# Patient Record
Sex: Female | Born: 1950 | Race: Black or African American | Hispanic: No | Marital: Married | Smoking: Never smoker
Health system: Southern US, Community
[De-identification: ages and names within clinical notes are randomized; demographics above are authoritative.]

## PROBLEM LIST (undated history)

## (undated) DIAGNOSIS — T7840XA Allergy, unspecified, initial encounter: Secondary | ICD-10-CM

## (undated) DIAGNOSIS — E119 Type 2 diabetes mellitus without complications: Secondary | ICD-10-CM

## (undated) DIAGNOSIS — N189 Chronic kidney disease, unspecified: Secondary | ICD-10-CM

## (undated) HISTORY — DX: Chronic kidney disease, unspecified: N18.9

## (undated) HISTORY — DX: Type 2 diabetes mellitus without complications: E11.9

## (undated) HISTORY — DX: Allergy, unspecified, initial encounter: T78.40XA

---

## 1997-09-01 ENCOUNTER — Ambulatory Visit (HOSPITAL_COMMUNITY): Admission: RE | Admit: 1997-09-01 | Discharge: 1997-09-01 | Payer: Self-pay | Admitting: Family Medicine

## 1999-01-20 ENCOUNTER — Encounter: Payer: Self-pay | Admitting: Obstetrics and Gynecology

## 1999-01-20 ENCOUNTER — Ambulatory Visit (HOSPITAL_COMMUNITY): Admission: RE | Admit: 1999-01-20 | Discharge: 1999-01-20 | Payer: Self-pay | Admitting: Family Medicine

## 2001-03-26 ENCOUNTER — Ambulatory Visit (HOSPITAL_COMMUNITY): Admission: RE | Admit: 2001-03-26 | Discharge: 2001-03-26 | Payer: Self-pay | Admitting: Family Medicine

## 2001-03-26 ENCOUNTER — Encounter: Payer: Self-pay | Admitting: Family Medicine

## 2002-09-08 ENCOUNTER — Ambulatory Visit (HOSPITAL_COMMUNITY): Admission: RE | Admit: 2002-09-08 | Discharge: 2002-09-08 | Payer: Self-pay | Admitting: Family Medicine

## 2002-09-08 ENCOUNTER — Encounter: Payer: Self-pay | Admitting: Family Medicine

## 2002-11-01 ENCOUNTER — Emergency Department (HOSPITAL_COMMUNITY): Admission: EM | Admit: 2002-11-01 | Discharge: 2002-11-01 | Payer: Self-pay | Admitting: Emergency Medicine

## 2004-03-14 ENCOUNTER — Ambulatory Visit: Payer: Self-pay | Admitting: Family Medicine

## 2004-05-04 ENCOUNTER — Ambulatory Visit: Payer: Self-pay | Admitting: Family Medicine

## 2004-09-29 ENCOUNTER — Ambulatory Visit (HOSPITAL_COMMUNITY): Admission: RE | Admit: 2004-09-29 | Discharge: 2004-09-29 | Payer: Self-pay | Admitting: Family Medicine

## 2005-05-14 ENCOUNTER — Encounter: Admission: RE | Admit: 2005-05-14 | Discharge: 2005-05-14 | Payer: Self-pay | Admitting: Family Medicine

## 2006-01-21 ENCOUNTER — Emergency Department (HOSPITAL_COMMUNITY): Admission: EM | Admit: 2006-01-21 | Discharge: 2006-01-22 | Payer: Self-pay | Admitting: Emergency Medicine

## 2006-01-25 ENCOUNTER — Inpatient Hospital Stay (HOSPITAL_COMMUNITY): Admission: EM | Admit: 2006-01-25 | Discharge: 2006-02-01 | Payer: Self-pay | Admitting: Emergency Medicine

## 2006-02-04 ENCOUNTER — Ambulatory Visit (HOSPITAL_COMMUNITY): Admission: RE | Admit: 2006-02-04 | Discharge: 2006-02-05 | Payer: Self-pay | Admitting: General Surgery

## 2006-02-11 ENCOUNTER — Emergency Department (HOSPITAL_COMMUNITY): Admission: EM | Admit: 2006-02-11 | Discharge: 2006-02-12 | Payer: Self-pay | Admitting: Emergency Medicine

## 2006-10-21 ENCOUNTER — Ambulatory Visit: Admission: RE | Admit: 2006-10-21 | Discharge: 2006-10-21 | Payer: Self-pay | Admitting: Urology

## 2007-08-11 ENCOUNTER — Encounter: Admission: RE | Admit: 2007-08-11 | Discharge: 2007-08-11 | Payer: Self-pay | Admitting: Family Medicine

## 2007-08-26 ENCOUNTER — Encounter: Admission: RE | Admit: 2007-08-26 | Discharge: 2007-08-26 | Payer: Self-pay | Admitting: Family Medicine

## 2007-11-28 ENCOUNTER — Ambulatory Visit (HOSPITAL_COMMUNITY): Admission: RE | Admit: 2007-11-28 | Discharge: 2007-11-28 | Payer: Self-pay | Admitting: Family Medicine

## 2008-06-06 IMAGING — CR DG CHEST 2V
2 series · 2 of 2 positions shown · non-contrast
Comparison: 05/14/05.

CLINICAL DATA: Fever, cough and congestion.  
 CHEST - 2 VIEW:

[view not recorded (1 of 2)]
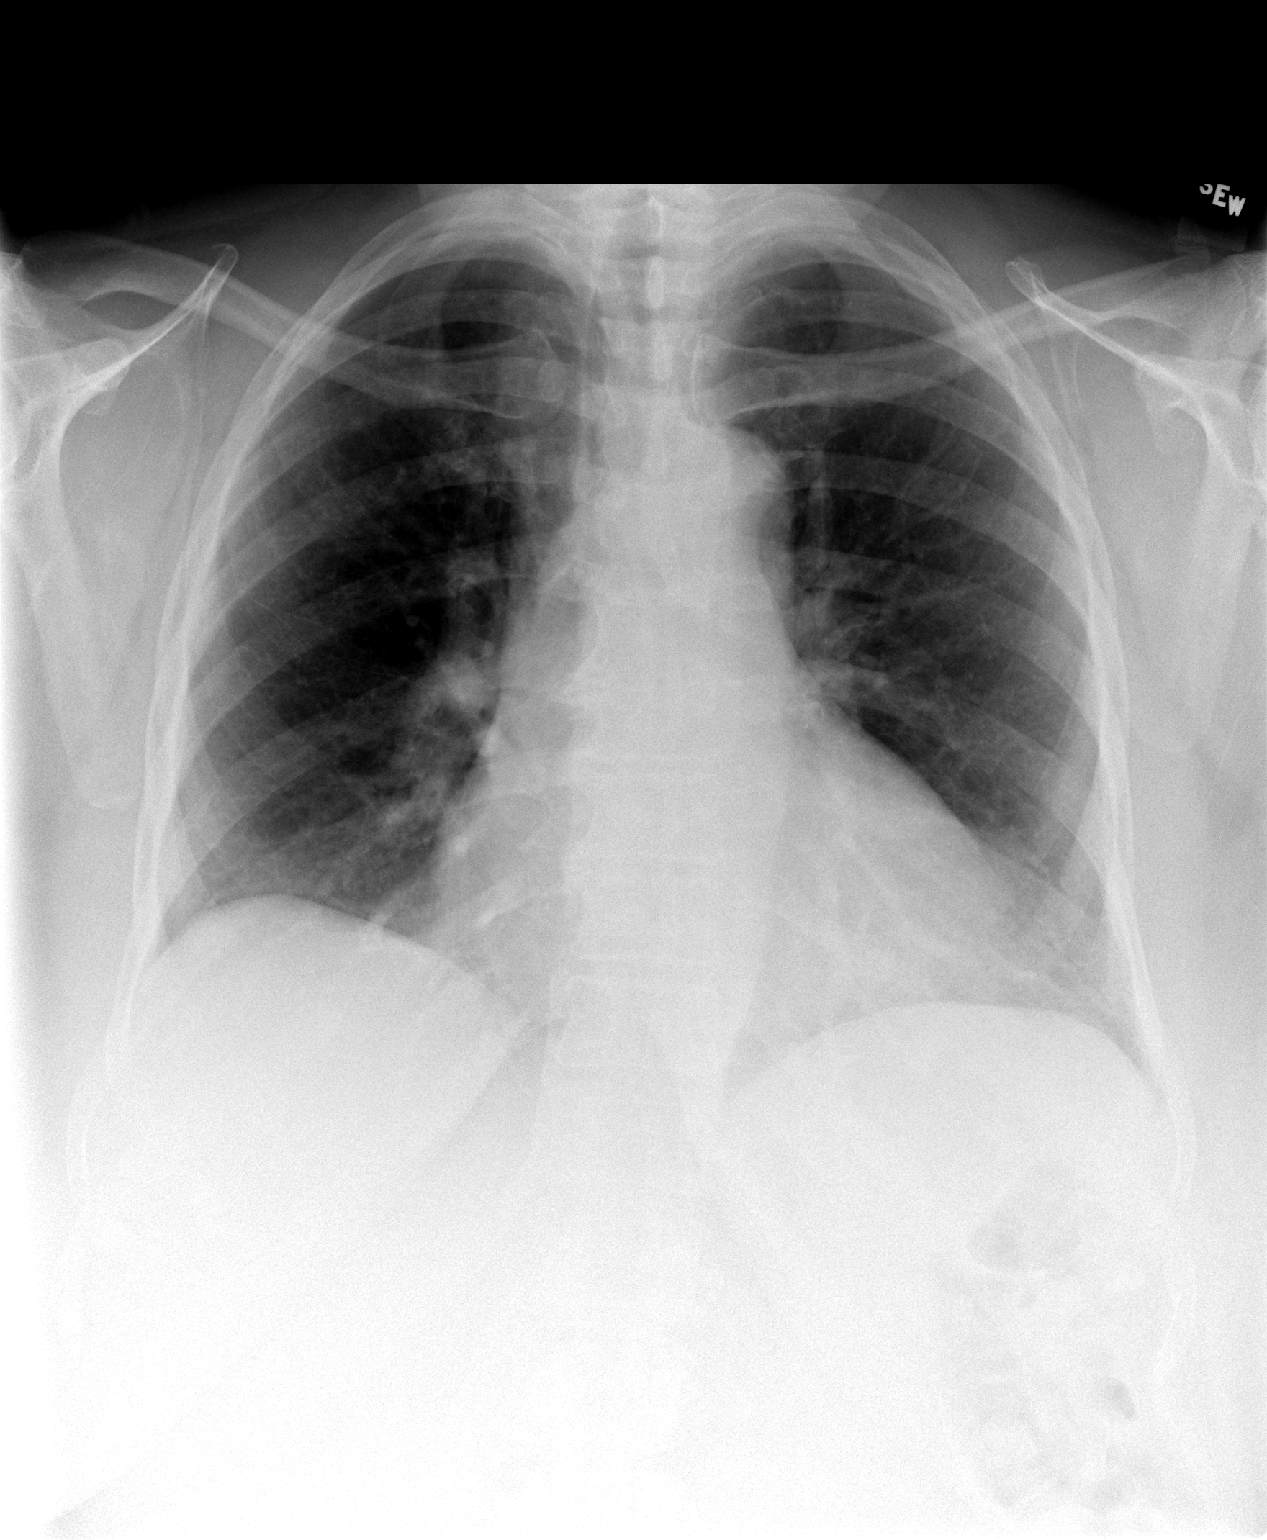

[view not recorded (2 of 2)]
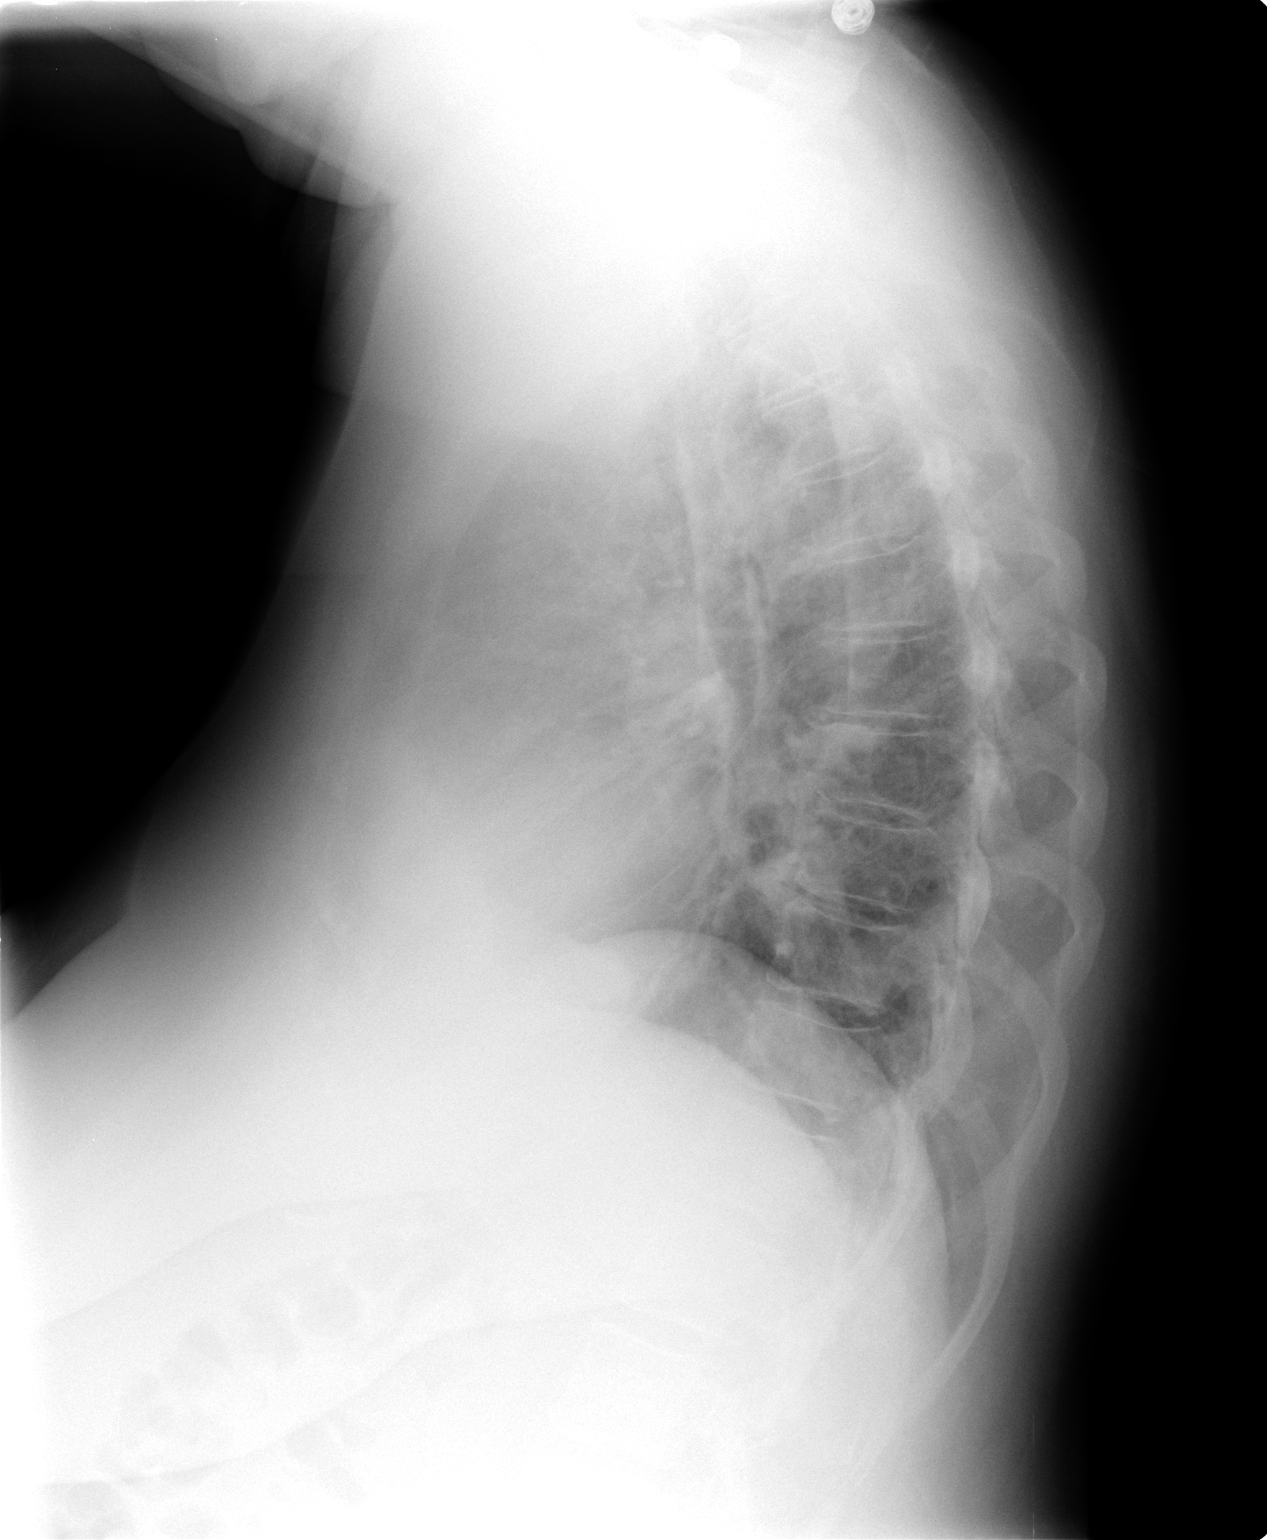

[2 of 2 positions shown; findings below may reference images not displayed]

FINDINGS: Heart size is enlarged.  There is no effusion or edema.  No airspace opacities are identified.
IMPRESSION: 1.  Cardiac enlargement.  
 2.  No acute findings.

## 2009-02-15 ENCOUNTER — Encounter: Payer: Self-pay | Admitting: Family Medicine

## 2010-02-25 ENCOUNTER — Inpatient Hospital Stay (HOSPITAL_COMMUNITY)
Admission: EM | Admit: 2010-02-25 | Discharge: 2010-03-03 | Payer: Self-pay | Source: Home / Self Care | Attending: Internal Medicine | Admitting: Internal Medicine

## 2010-02-27 LAB — URINE MICROSCOPIC-ADD ON

## 2010-02-27 LAB — CBC
HCT: 34.5 % — ABNORMAL LOW (ref 36.0–46.0)
HCT: 29.9 % — ABNORMAL LOW (ref 36.0–46.0)
Hemoglobin: 11.3 g/dL — ABNORMAL LOW (ref 12.0–15.0)
Hemoglobin: 9.7 g/dL — ABNORMAL LOW (ref 12.0–15.0)
MCH: 28 pg (ref 26.0–34.0)
MCH: 27.8 pg (ref 26.0–34.0)
MCHC: 32.8 g/dL (ref 30.0–36.0)
MCHC: 32.4 g/dL (ref 30.0–36.0)
MCV: 85.6 fL (ref 78.0–100.0)
MCV: 85.7 fL (ref 78.0–100.0)
Platelets: 286 10*3/uL (ref 150–400)
Platelets: 245 10*3/uL (ref 150–400)
RBC: 4.03 MIL/uL (ref 3.87–5.11)
RBC: 3.49 MIL/uL — ABNORMAL LOW (ref 3.87–5.11)
RDW: 14.7 % (ref 11.5–15.5)
RDW: 14.9 % (ref 11.5–15.5)
WBC: 13 10*3/uL — ABNORMAL HIGH (ref 4.0–10.5)
WBC: 15.2 10*3/uL — ABNORMAL HIGH (ref 4.0–10.5)

## 2010-02-27 LAB — COMPREHENSIVE METABOLIC PANEL
ALT: 12 U/L (ref 0–35)
AST: 19 U/L (ref 0–37)
Albumin: 2.4 g/dL — ABNORMAL LOW (ref 3.5–5.2)
Alkaline Phosphatase: 98 U/L (ref 39–117)
BUN: 10 mg/dL (ref 6–23)
CO2: 21 mEq/L (ref 19–32)
Calcium: 8.5 mg/dL (ref 8.4–10.5)
Chloride: 101 mEq/L (ref 96–112)
Creatinine, Ser: 1.8 mg/dL — ABNORMAL HIGH (ref 0.4–1.2)
GFR calc Af Amer: 35 mL/min — ABNORMAL LOW (ref >60)
GFR calc non Af Amer: 29 mL/min — ABNORMAL LOW (ref >60)
Glucose, Bld: 311 mg/dL — ABNORMAL HIGH (ref 70–99)
Potassium: 4.7 mEq/L (ref 3.5–5.1)
Sodium: 133 mEq/L — ABNORMAL LOW (ref 135–145)
Total Bilirubin: 1.1 mg/dL (ref 0.3–1.2)
Total Protein: 7.2 g/dL (ref 6.0–8.3)

## 2010-02-27 LAB — GLUCOSE, CAPILLARY
Glucose-Capillary: 294 mg/dL — ABNORMAL HIGH (ref 70–99)
Glucose-Capillary: 248 mg/dL — ABNORMAL HIGH (ref 70–99)

## 2010-02-27 LAB — HEPATIC FUNCTION PANEL
ALT: 10 U/L (ref 0–35)
AST: 14 U/L (ref 0–37)
Albumin: 2.8 g/dL — ABNORMAL LOW (ref 3.5–5.2)
Alkaline Phosphatase: 106 U/L (ref 39–117)
Bilirubin, Direct: 0.2 mg/dL (ref 0.0–0.3)
Indirect Bilirubin: 0.9 mg/dL (ref 0.3–0.9)
Total Bilirubin: 1.1 mg/dL (ref 0.3–1.2)
Total Protein: 8.1 g/dL (ref 6.0–8.3)

## 2010-02-27 LAB — URINALYSIS, ROUTINE W REFLEX MICROSCOPIC
Ketones, ur: 15 mg/dL — AB
Nitrite: NEGATIVE
Protein, ur: 100 mg/dL — AB
Specific Gravity, Urine: 1.023 (ref 1.005–1.030)
Urine Glucose, Fasting: 250 mg/dL — AB
Urobilinogen, UA: 1 mg/dL (ref 0.0–1.0)
pH: 6 (ref 5.0–8.0)

## 2010-02-27 LAB — POCT I-STAT, CHEM 8
BUN: 7 mg/dL (ref 6–23)
Calcium, Ion: 1.13 mmol/L (ref 1.12–1.32)
Chloride: 99 mEq/L (ref 96–112)
Creatinine, Ser: 1.6 mg/dL — ABNORMAL HIGH (ref 0.4–1.2)
Glucose, Bld: 301 mg/dL — ABNORMAL HIGH (ref 70–99)
HCT: 36 % (ref 36.0–46.0)
Hemoglobin: 12.2 g/dL (ref 12.0–15.0)
Potassium: 4.5 mEq/L (ref 3.5–5.1)
Sodium: 132 mEq/L — ABNORMAL LOW (ref 135–145)
TCO2: 23 mmol/L (ref 0–100)

## 2010-02-27 LAB — LACTIC ACID, PLASMA: Lactic Acid, Venous: 1.9 mmol/L (ref 0.5–2.2)

## 2010-02-27 LAB — HEMOGLOBIN A1C
Hgb A1c MFr Bld: 9.4 % — ABNORMAL HIGH (ref <5.7)
Mean Plasma Glucose: 223 mg/dL — ABNORMAL HIGH (ref <117)

## 2010-02-27 LAB — DIFFERENTIAL
Basophils Absolute: 0 10*3/uL (ref 0.0–0.1)
Basophils Relative: 0 % (ref 0–1)
Eosinophils Absolute: 0 10*3/uL (ref 0.0–0.7)
Eosinophils Relative: 0 % (ref 0–5)
Lymphocytes Relative: 5 % — ABNORMAL LOW (ref 12–46)
Lymphs Abs: 0.6 10*3/uL — ABNORMAL LOW (ref 0.7–4.0)
Monocytes Absolute: 0.7 10*3/uL (ref 0.1–1.0)
Monocytes Relative: 5 % (ref 3–12)
Neutro Abs: 11.6 10*3/uL — ABNORMAL HIGH (ref 1.7–7.7)
Neutrophils Relative %: 89 % — ABNORMAL HIGH (ref 43–77)

## 2010-02-27 LAB — PROCALCITONIN: Procalcitonin: 20.17 ng/mL

## 2010-02-27 LAB — LIPASE, BLOOD: Lipase: 15 U/L (ref 11–59)

## 2010-03-01 LAB — GLUCOSE, CAPILLARY
Glucose-Capillary: 115 mg/dL — ABNORMAL HIGH (ref 70–99)
Glucose-Capillary: 252 mg/dL — ABNORMAL HIGH (ref 70–99)
Glucose-Capillary: 347 mg/dL — ABNORMAL HIGH (ref 70–99)
Glucose-Capillary: 396 mg/dL — ABNORMAL HIGH (ref 70–99)
Glucose-Capillary: 87 mg/dL (ref 70–99)
Glucose-Capillary: 112 mg/dL — ABNORMAL HIGH (ref 70–99)
Glucose-Capillary: 153 mg/dL — ABNORMAL HIGH (ref 70–99)
Glucose-Capillary: 168 mg/dL — ABNORMAL HIGH (ref 70–99)
Glucose-Capillary: 233 mg/dL — ABNORMAL HIGH (ref 70–99)
Glucose-Capillary: 336 mg/dL — ABNORMAL HIGH (ref 70–99)
Glucose-Capillary: 344 mg/dL — ABNORMAL HIGH (ref 70–99)
Glucose-Capillary: 93 mg/dL (ref 70–99)

## 2010-03-01 LAB — DIFFERENTIAL
Basophils Absolute: 0 10*3/uL (ref 0.0–0.1)
Basophils Absolute: 0 10*3/uL (ref 0.0–0.1)
Basophils Relative: 0 % (ref 0–1)
Basophils Relative: 0 % (ref 0–1)
Eosinophils Absolute: 0.1 10*3/uL (ref 0.0–0.7)
Eosinophils Absolute: 0.1 10*3/uL (ref 0.0–0.7)
Eosinophils Relative: 1 % (ref 0–5)
Eosinophils Relative: 1 % (ref 0–5)
Lymphocytes Relative: 3 % — ABNORMAL LOW (ref 12–46)
Lymphocytes Relative: 8 % — ABNORMAL LOW (ref 12–46)
Lymphs Abs: 0.3 10*3/uL — ABNORMAL LOW (ref 0.7–4.0)
Lymphs Abs: 0.9 10*3/uL (ref 0.7–4.0)
Monocytes Absolute: 0.2 10*3/uL (ref 0.1–1.0)
Monocytes Absolute: 0.9 10*3/uL (ref 0.1–1.0)
Monocytes Relative: 2 % — ABNORMAL LOW (ref 3–12)
Monocytes Relative: 8 % (ref 3–12)
Neutro Abs: 9.2 10*3/uL — ABNORMAL HIGH (ref 1.7–7.7)
Neutro Abs: 10.1 10*3/uL — ABNORMAL HIGH (ref 1.7–7.7)
Neutrophils Relative %: 94 % — ABNORMAL HIGH (ref 43–77)
Neutrophils Relative %: 84 % — ABNORMAL HIGH (ref 43–77)

## 2010-03-01 LAB — BASIC METABOLIC PANEL
BUN: 12 mg/dL (ref 6–23)
BUN: 11 mg/dL (ref 6–23)
CO2: 20 mEq/L (ref 19–32)
CO2: 21 mEq/L (ref 19–32)
Calcium: 7.9 mg/dL — ABNORMAL LOW (ref 8.4–10.5)
Calcium: 8.1 mg/dL — ABNORMAL LOW (ref 8.4–10.5)
Chloride: 104 mEq/L (ref 96–112)
Chloride: 110 mEq/L (ref 96–112)
Creatinine, Ser: 1.95 mg/dL — ABNORMAL HIGH (ref 0.4–1.2)
Creatinine, Ser: 1.58 mg/dL — ABNORMAL HIGH (ref 0.4–1.2)
GFR calc Af Amer: 32 mL/min — ABNORMAL LOW (ref >60)
GFR calc Af Amer: 41 mL/min — ABNORMAL LOW (ref >60)
GFR calc non Af Amer: 26 mL/min — ABNORMAL LOW (ref >60)
GFR calc non Af Amer: 33 mL/min — ABNORMAL LOW (ref >60)
Glucose, Bld: 320 mg/dL — ABNORMAL HIGH (ref 70–99)
Glucose, Bld: 171 mg/dL — ABNORMAL HIGH (ref 70–99)
Potassium: 3.8 mEq/L (ref 3.5–5.1)
Potassium: 4.2 mEq/L (ref 3.5–5.1)
Sodium: 134 mEq/L — ABNORMAL LOW (ref 135–145)
Sodium: 138 mEq/L (ref 135–145)

## 2010-03-01 LAB — URINE CULTURE
Colony Count: >=100000
Culture  Setup Time: 201201150320

## 2010-03-01 LAB — CBC
HCT: 29 % — ABNORMAL LOW (ref 36.0–46.0)
HCT: 27.9 % — ABNORMAL LOW (ref 36.0–46.0)
Hemoglobin: 9.1 g/dL — ABNORMAL LOW (ref 12.0–15.0)
Hemoglobin: 8.8 g/dL — ABNORMAL LOW (ref 12.0–15.0)
MCH: 27 pg (ref 26.0–34.0)
MCH: 27.1 pg (ref 26.0–34.0)
MCHC: 31.4 g/dL (ref 30.0–36.0)
MCHC: 31.5 g/dL (ref 30.0–36.0)
MCV: 86.1 fL (ref 78.0–100.0)
MCV: 85.8 fL (ref 78.0–100.0)
Platelets: 196 10*3/uL (ref 150–400)
Platelets: 237 10*3/uL (ref 150–400)
RBC: 3.37 MIL/uL — ABNORMAL LOW (ref 3.87–5.11)
RBC: 3.25 MIL/uL — ABNORMAL LOW (ref 3.87–5.11)
RDW: 15.1 % (ref 11.5–15.5)
RDW: 15.5 % (ref 11.5–15.5)
WBC: 9.8 10*3/uL (ref 4.0–10.5)
WBC: 12 10*3/uL — ABNORMAL HIGH (ref 4.0–10.5)

## 2010-03-01 LAB — LACTIC ACID, PLASMA: Lactic Acid, Venous: 3.1 mmol/L — ABNORMAL HIGH (ref 0.5–2.2)

## 2010-03-04 NOTE — H&P (Signed)
NAME:  Donna Arnold, Donna Arnold NO.:  [id number]  MEDICAL RECORD NO.:  [id number]          PATIENT TYPE:  EMS  LOCATION:  ED                           FACILITY:  [facility]  PHYSICIAN:  Hilary Hertz, MD      DATE OF BIRTH:  1950-05-29  DATE OF ADMISSION:  02/25/2010 DATE OF DISCHARGE:                             HISTORY & PHYSICAL   PRIMARY CARE PHYSICIAN:  Dr. Fabian November in [place], [place].  CHIEF COMPLAINT:  Abdominal pain.  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  The patient reports that she has had 7 stents placed between 2007 to 2011.  She has a history of recurrent UTIs and urosepsis as well as CKD who presents with abdominal pain for the past month.  She reports the pain is daily.  It is stabbing in nature and she is unable to sleep due to the pain.  She also reports decreased amounts of urination and some dysuria.  She did not have any documented fevers at home.  She has had decreased appetite over the course of a month and has had nausea and vomiting and vomited 3 times yesterday. Regarding her urologic history, the patient has been seen at multiple hospitals, the last stent was placed in September of 2011 at [facility].  Per the records that she has brought with her, she has also been seen at [facility], [place], [place], and [facility].  Of note, the emergency room physician tried to contact Urology regarding this patient and they have stated that the patient has gotten fired from their clinic twice now. So, they requested that the patient be admitted to hospitalist service and they will see her in consultation.  REVIEW OF SYSTEMS:  Review of 10 organ systems was done and is negative except as stated above in HPI.  ALLERGIES: 1. SULFA (unknown reaction). 2. MACROBID (unknown reaction) 3. TOPROL XL. 4. CEFTIN  (unknown reaction). 5. VERAPAMIL.  MEDICATIONS: 1. Glipizide 5 once a day. 2. Humulin N insulin 12 units q.h.s. 3. Metformin 500 daily. 4. Tiazac 180 b.i.d.  PAST MEDICAL HISTORY:  Hypertension, diabetes, UTI, urosepsis, CKD, asthma, vesicovaginal fistula, left UPJ obstruction status post 7 stents between 2007 and 2011 at  multiple different hospitals.  SOCIAL HISTORY:  Nonsmoker, no alcohol.  Married, lives with her husband.  FAMILY HISTORY:  Multiple family members with diabetes and hypertension.  PHYSICAL EXAMINATION:  VITAL SIGNS:  Blood pressure 180/91, pulse 123, respirations 20, temperature 101.5. GENERAL:  No acute distress. HEENT: Dry mucous membranes. CV: Tachy.  No murmurs. LUNGS: Clear to auscultation bilaterally. ABDOMEN:  Obese, soft, minimally tender diffusely without guarding or rebound. EXTREMITIES:  No edema. NEURO:  Cranial nerve II through XII grossly intact. BACK:  No CVA tenderness.  LABORATORY DATA:  White count 13, hemoglobin 11.3, platelets 286. Sodium 132, potassium 4.5, chloride 99, bicarb 23, BUN 7, creatinine 1.6, glucose 301, AST 14, ALT 10, lipase 15, lactic acid 1.9.  UA, positive blood, positive nitrites, positive leukocyte esterase. Microscopic, too many  WBC's to count, many bacteria.  Procalcitonin is 20.  CT abdomen and pelvis shows: 1. Severe left hydronephrosis and left periureteral stranding with     left UPJ obstruction, no stone was identified, cannot confidently     exclude a mass on this noncontrast exam, left pyelonephritis is not     excluded. 2. Diffuse hepatic steatosis. 3. Scattered colonic diverticulosis without active diverticulitis.  ASSESSMENT AND PLAN:  The patient is a 60 year old woman with a history of diabetes, hypertension, vesicovaginal fistula and known left ureteropelvic obstruction who presents with abdominal pain. 1. Abdominal pain, this is likely due to left ureteropelvic junction     obstruction with  hydronephrosis causing pyelonephritis.  At this     time, the patient is hemodynamically stable.  Urology has been     consulted and they will see the patient in the emergency     department.  I have been told that they have reviewed her CT scan     at this point and they are okay with her just being treated with     antibiotics for now.  She was given ceftriaxone in the emergency     department.  Due to the patient's immunocompromised status because of     her diabetes and urine obstruction, will put the patient on Zosyn     for now.  I have discussed with pharmacy that the patient has an     allergy to Ceftin.  The patient's allergy is unknown.  She says "it     was so long ago, she does not remember."  However, she has never     been hospitalized to the intensive care unit for anaphylaxis.  The     crossreactivity of Zosyn or any other antibiotic in this group is 5-10%.  At this time,     we will give the patient Zosyn because it is indicated by her clinical     status and we will monitor for any reaction.  We will also check     blood cultures and urine cultures for further microbiology data. 2. Hypertension.  The patient is hypertensive at 180/91 currently.      will continue her home meds of Tiazac. 3. Diabetes.  Given that the patient is n.p.o. for now, will hold her     p.o. diabetes meds of metformin and glipizide, and we will place     her on sliding scale insulin.  We hold her standing home Humalog     for now. 4. Fluids, electrolytes, nutrition.  We will put the patient on     maintenance IV fluids, replete electrolytes, make her     n.p.o. for now for any possible surgical procedure as well as the     patient's abdominal pain. 5. Prophylaxis.  Put patient on heparin t.i.d. for DVT prophylaxis.  DISPOSITION:  The patient is full code.  She will be admitted for further workup and Urology consultation.          ______________________________ Hilary Hertz,  MD     JF/MEDQ  D:  02/26/2010  T:  02/26/2010  Job:  [id number]  Electronically Signed by Hilary Hertz MD on 03/04/2010 07:36:56 PM

## 2010-03-05 ENCOUNTER — Encounter: Payer: Self-pay | Admitting: Family Medicine

## 2010-03-05 NOTE — Consult Note (Addendum)
NAME:  Donna Arnold, COLLETT NO.:  [id number]  MEDICAL RECORD NO.:  [id number]          PATIENT TYPE:  INP  LOCATION:  0101                         FACILITY:  [facility]  PHYSICIAN:  Heloise Purpura, MD      DATE OF BIRTH:  06/19/1950  DATE OF CONSULTATION:  02/26/2010 DATE OF DISCHARGE:                                CONSULTATION   REASON FOR CONSULTATION:  Left hydronephrosis with history of ureteropelvic junction obstruction.  PHYSICIAN REQUESTING CONSULTATION:  Paula Libra, MD.  HISTORY:  Ms. Donna Arnold is a 60 year old female with a known history of chronic congenital left ureteropelvic junction obstruction.  She had been evaluated by Dr. Gaynelle Arabian in 2007 when she apparently presented with an acute urinary tract infection and had a ureteral stent placed for left hydronephrosis which had not previously been evaluated at that time.  For unspecified reasons, she was dismissed from our practice shortly thereafter and was sent to [facility] where she underwent further evaluation.  Of note, she did also have a history of a vesicovaginal fistula.  I have been able to review some notes from 2007 at [facility] where they had discussed proceeding with further treatment of her vesicovaginal fistula and ureteropelvic junction obstruction.  However, for some reason, she did not proceed with therapy.  Although she had been dismissed by Dr. Earlene Plater, she apparently was evaluated by Dr. Marcelyn Bruins at our practice in the fall of 2008 and had been scheduled to undergo treatment of her vesicovaginal fistula with subsequent plans to repair her ureteropelvic junction obstruction.  Per Dr. Logan Bores' notes, the patient abruptly cancelled her surgery the day before it was to occur and again for unspecified reasons, she was dismissed by Dr. Logan Bores and officially dismissed from our practice and recommended that she obtain urologic care elsewhere.  More recently, she has been evaluated  and treated by the [facility] Urology Department and had a ureteral stent placed in September 2011 by Dr. Leeroy Bock.  She has subsequently been seen by Dr. Luane School and had discussed treatment of both her vesicovaginal fistula and her ureteropelvic junction obstruction.  Dr. Luane School removed her ureteral stent on January 24, 2010, with apparent subsequent plans to proceed with definitive surgical treatment.  However, the patient states that she had at least temporarily decided to hold off on further therapy.  She presents today with a 2-day history of worsening severe abdominal pain which is fairly diffuse and also left-sided flank pain.  She also had a fever to 103 degrees Fahrenheit with associated nausea and vomiting.  She was noted to be mildly tachycardic, although with stable blood pressure.  She denies lower urinary tracts symptoms such as dysuria, frequency or urgency.  PAST MEDICAL HISTORY: 1. Diabetes. 2. Hypertension. 3. History of left ureteropelvic junction obstruction.  PAST SURGICAL HISTORY:  She has undergone multiple ureteral stent and has had a hysterectomy.  CURRENT MEDICATIONS: 1. Tiazac. 2. Glipizide. 3. Metformin. 4. Insulin.  ALLERGIES: 1. MACROBID. 2. SULFA. 3. She also states that she is resistant to CIPRO.  FAMILY HISTORY:  No GU malignancy.  SOCIAL HISTORY:  She denies alcohol,  tobacco or drug use.  She is married.  REVIEW OF SYSTEMS:  Complete review of systems was performed.  Pertinent findings are as described in history of present illness.  PHYSICAL EXAMINATION:  VITALS:  She was noted to be febrile with a temperature of 103.1 degrees Fahrenheit on admission to the Emergency Department, which is currently decreased 100.7.  Her heart rate was 123, respirations 20, blood pressure 180/90. CONSTITUTIONAL:  Well-nourished, well-developed female in no acute distress. HEENT:  Normocephalic, atraumatic. NECK:  Supple without lymphadenopathy or  JVD. CARDIOVASCULAR:  Normal rate and rhythm. PULMONARY:  Normal respiratory effort. ABDOMEN:  She is mildly tender throughout her left upper quadrant and left lower quadrant without rebound tenderness or guarding.  She has no right CVA tenderness and has very mild left CVA tenderness. EXTREMITIES:  No edema. NEUROLOGIC:  Grossly intact.  LABORATORY DATA:  Serum creatinine 1.6.  White blood count 13.0. Urinalysis with many squamous epithelial cells, too numerous count white blood cells, 0 to 2 red blood cells and many bacteria.  Procalcitonin 20.17.  Lactic acid 1.9.  Hemoglobin 11.3.  Urine culture is pending.  RADIOLOGIC IMAGING:  I independently reviewed her CT stone study.  This demonstrates left-sided hydronephrosis without a dilated ureter.  This is very similar to the hydronephrosis seen on her prior imaging in 2008. Her renal parenchyma does appear to be somewhat thinner than it was in 2008, indicating that she might have lost some renal function over this time.  There are no obstructing ureteral calculi or other sources for obvious obstruction.  IMPRESSION: 1. 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 2. Pyelonephritis. 3. Vesicovaginal fistula.  RECOMMENDATIONS:  Ms. Donna Arnold is to be admitted to the hospitalist service for IV antibiotic therapy to treat her pyelonephritis. Considering that congenital ureteropelvic junction obstructions are almost always partial obstructions and not complete obstructions, I do not see the need to proceed with renal drainage with either ureteral stenting or percutaneous nephrostomy drainage at this time.  I will plan to follow her closely, and if she does not appear to be responding as would be expected after broad IV antibiotic therapy, I would then consider a renal drainage procedure over the next 48 hours as necessary. Assuming that she does improve as expected with medical therapy, she will require further  urologic followup for her ureteropelvic junction and her vesicovaginal fistula.  I have strongly urged and recommended that she follow up with Dr. Porfirio Oar or another urologist if she so chooses but made it very clear to her that if she chose not to proceed with further evaluation and treatment that she could likely lose function of her left kidney over time.  I have told her that while she has been dismissed from our practice, I am very happy to provide her any kind of urgent or emergent care that she might need while hospitalized in the [facility].  I have told her that she will need to follow up for further elective urologic care elsewhere following her hospitalization and resolution of her acute problem considering her prior dismissal from our practice.     Heloise Purpura, MD     LB/MEDQ  D:  02/26/2010  T:  02/26/2010  Job:  [id number]  cc:   Paula Libra, MD Fax: [phone]  Electronically Signed by Heloise Purpura MD on 03/05/2010 05:27:20 PM

## 2010-03-06 ENCOUNTER — Encounter: Payer: Self-pay | Admitting: Family Medicine

## 2010-03-06 LAB — CBC
HCT: 26.8 % — ABNORMAL LOW (ref 36.0–46.0)
Hemoglobin: 8.6 g/dL — ABNORMAL LOW (ref 12.0–15.0)
Hemoglobin: 8.9 g/dL — ABNORMAL LOW (ref 12.0–15.0)
MCH: 27.2 pg (ref 26.0–34.0)
MCHC: 31.9 g/dL (ref 30.0–36.0)
MCHC: 32.1 g/dL (ref 30.0–36.0)
Platelets: 275 10*3/uL (ref 150–400)
Platelets: 380 10*3/uL (ref 150–400)
RBC: 3.15 MIL/uL — ABNORMAL LOW (ref 3.87–5.11)
RBC: 3.27 MIL/uL — ABNORMAL LOW (ref 3.87–5.11)
RDW: 15.8 % — ABNORMAL HIGH (ref 11.5–15.5)
WBC: 10.6 10*3/uL — ABNORMAL HIGH (ref 4.0–10.5)
WBC: 11.2 10*3/uL — ABNORMAL HIGH (ref 4.0–10.5)

## 2010-03-06 LAB — DIFFERENTIAL
Basophils Absolute: 0 10*3/uL (ref 0.0–0.1)
Eosinophils Absolute: 0.1 10*3/uL (ref 0.0–0.7)
Lymphocytes Relative: 12 % (ref 12–46)
Monocytes Absolute: 1 10*3/uL (ref 0.1–1.0)
Neutrophils Relative %: 78 % — ABNORMAL HIGH (ref 43–77)

## 2010-03-06 LAB — BASIC METABOLIC PANEL
CO2: 22 mEq/L (ref 19–32)
Calcium: 8.1 mg/dL — ABNORMAL LOW (ref 8.4–10.5)
Chloride: 110 mEq/L (ref 96–112)
Chloride: 106 mEq/L (ref 96–112)
Creatinine, Ser: 1.35 mg/dL — ABNORMAL HIGH (ref 0.4–1.2)
GFR calc Af Amer: 44 mL/min — ABNORMAL LOW (ref >60)
GFR calc Af Amer: 49 mL/min — ABNORMAL LOW (ref >60)
GFR calc non Af Amer: 37 mL/min — ABNORMAL LOW (ref >60)
Glucose, Bld: 117 mg/dL — ABNORMAL HIGH (ref 70–99)
Potassium: 4.4 mEq/L (ref 3.5–5.1)
Potassium: 4.7 mEq/L (ref 3.5–5.1)
Sodium: 137 mEq/L (ref 135–145)
Sodium: 137 mEq/L (ref 135–145)
Sodium: 136 mEq/L (ref 135–145)

## 2010-03-06 LAB — GLUCOSE, CAPILLARY
Glucose-Capillary: 131 mg/dL — ABNORMAL HIGH (ref 70–99)
Glucose-Capillary: 132 mg/dL — ABNORMAL HIGH (ref 70–99)
Glucose-Capillary: 133 mg/dL — ABNORMAL HIGH (ref 70–99)
Glucose-Capillary: 138 mg/dL — ABNORMAL HIGH (ref 70–99)
Glucose-Capillary: 120 mg/dL — ABNORMAL HIGH (ref 70–99)
Glucose-Capillary: 125 mg/dL — ABNORMAL HIGH (ref 70–99)
Glucose-Capillary: 145 mg/dL — ABNORMAL HIGH (ref 70–99)
Glucose-Capillary: 182 mg/dL — ABNORMAL HIGH (ref 70–99)
Glucose-Capillary: 227 mg/dL — ABNORMAL HIGH (ref 70–99)
Glucose-Capillary: 161 mg/dL — ABNORMAL HIGH (ref 70–99)

## 2010-03-06 LAB — GENTAMICIN LEVEL, RANDOM: Gentamicin Rm: 7.2 ug/mL

## 2010-03-07 LAB — CULTURE, BLOOD (ROUTINE X 2)
Culture  Setup Time: 201201151140
Culture  Setup Time: 201201151140
Culture  Setup Time: 201201160024
Culture: NO GROWTH
Culture: NO GROWTH
Culture: NO GROWTH

## 2010-03-14 NOTE — Discharge Summary (Signed)
NAME:  Donna Arnold, DLOUHY             ACCOUNT NO.:  [id number]  MEDICAL RECORD NO.:  [id number]          PATIENT TYPE:  INP  LOCATION:  1315                         FACILITY:  [facility]  PHYSICIAN:  Thad Ranger, MD       DATE OF BIRTH:  Jul 30, 1950  DATE OF ADMISSION:  02/25/2010 DATE OF DISCHARGE:                              DISCHARGE SUMMARY   PRIMARY CARE PHYSICIAN:  Dr. Katha Cabal in [place].  Please note that the patient is leaving the facility against medical advice.  DIAGNOSES: 1. Pyelonephritis in the setting of severe left-sided hydronephrosis. 2. 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 3. Escherichia coli sepsis of urinary source present at admission. 4. Hypertension, uncontrolled. 5. Hepatic steatosis. 6. History of multiple drug allergies. 7. Diabetes. 8. Bibasilar airspace disease.  CONSULTATION:  Heloise Purpura, MD, Urology.  PROCEDURES:  CT abdomen and pelvis, January 15: 1. Dominant finding is severe left hydronephrosis and left     periureteral stranding with left UPJ obstruction, no stone.  Cannot     confidently exclude a mass on today's noncontrast CT examination.     Left pyelonephritis, not excluded. 2. Diffuse hepatic steatosis. 3. Scattered colonic diverticulosis without active diverticulitis     identified.  Chest x-ray, 2-view, January 19, low volume chest with     bilateral basilar opacities which may represent airspace disease or     atelectasis.  Differential concentration for airspace disease     include infection, aspiration, and pulmonary edema.  Edema is     considered less likely.  HOSPITAL COURSE:  Please also refer to the excellent note dictated by Dr. Conley Canal on March 02, 2010.  In brief, Donna Arnold is a 60- year-old female with chronic ureteropelvic junction obstruction requiring ureteral stents on and off, who presented with complaints of abdominal pain and was found to have ongoing urinary tract  infection related to hydronephrosis.  The patient was started on broad-spectrum antibiotics including Zosyn.  Currently, the patient is on gentamicin and Rocephin based on the sensitivities obtained.  The patient has been followed by Urology, Dr. Laverle Patter, who feel that the patient should be on broad-spectrum IV antibiotics to complete 2 weeks course and then follow with Urology at [facility].  Urology notes the patient had been dismissed from their practice, which appears to be due to noncompliance and unspecified reasons.  The patient requires PICC line placement for IV antibiotics at home and home care to be arranged as she is currently still having low-grade fevers.  However, at this time, the patient has refused to continue with further care here at [facility] and refused to have any PICC line placed.  The patient also refused her p.o. medications for hypertension which included Coreg.  The patient appears to be frustrated as she expects ureteral stent to be placed; however, per Dr. Vevelyn Royals recommendations yesterday, the patient needs IV antibiotics for a total of 2 weeks course with PICC line placement and to follow up with Dr. Luane School at [facility].  The patient does not want to have any further care at this time at [facility].  She feels that  she has been off of antibiotics at home and does not want any IV antibiotics.  The patient at this time signed leaving against medical advice form.  The patient was strongly counseled the risk of ongoing pyelonephritis and infection which could lead to sepsis and death.  The patient states that she will follow up with her primary care physician and find herself a urologist/surgeon.  Total time spent, 35 minutes.     Thad Ranger, MD     RR/MEDQ  D:  03/03/2010  T:  03/03/2010  Job:  [id number]  cc:   Dr. Katha Cabal in [place], MD Fax: [phone]  Electronically Signed by Deigo Alonso  on 03/14/2010  04:20:19 PM

## 2010-03-16 NOTE — Letter (Signed)
Summary: Historic Patient File  Historic Patient File   Imported By: Lind Guest 02/15/2009 09:17:59  _____________________________________________________________________  External Attachment:    Type:   Image     Comment:   External Document

## 2010-03-19 NOTE — Progress Notes (Signed)
NAME:  Donna Arnold, Donna Arnold             ACCOUNT NO.:  [id number]  MEDICAL RECORD NO.:  [id number]          PATIENT TYPE:  INP  LOCATION:  1315                         FACILITY:  [facility]  PHYSICIAN:  Conley Canal, MD      DATE OF BIRTH:  [birth year]                                PROGRESS NOTE   PRIMARY CARE PHYSICIAN: Dr. Katha Cabal in [place].  PROBLEM LIST: 1. Pyelonephritis in the setting of severe left-sided hydronephrosis     in the setting of chronic congenital ureteropelvic junction. 2. Obstructive uropathy. 3. Escherichia coli, sepsis of urinary source present at admission. 4. Hypertension, uncontrolled. 5. Hepatic steatosis. 6. Multiple drug allergies.  PROCEDURES PERFORMED: CT abdomen and pelvis on January 15th showed CVA left hydronephrosis and left periureteral stranding with left UPJ obstruction.  No stone identified and diffuse hepatic steatosis, scattered diverticulosis without diverticulitis.  HOSPITAL COURSE: Donna Arnold is a pleasant 60 year old female with history of chronic ureteropelvic junction obstruction requiring ureteral stents on and off, who came in with complaints of abdominal pain and was found to have ongoing urinary tract infection related to hydronephrosis.  The patient was started on broad-spectrum antibiotics including Zosyn.  She is now on ceftriaxone and gentamicin.  She was seen by Urology, Dr. Laverle Patter, who felt that she should be on broad-spectrum IV antibiotics to complete at least 2 weeks and that she should follow with the Urology at [facility].  She will require PICC line placement for IV antibiotics at home and will need home care arranged for this concern at present is that until this morning, the patient still having high-grade fever of 101.3 degrees Fahrenheit, raising possibility of possible pus.  Her white count is barely elevated at 10.9.  If she continues to have fever, she may need ureteral stent placed in this admission as there is  no other clear explanation of recurrent fever other than possible pus collection given that she has severe left-sided hydronephrosis.  Otherwise, today the patient sounds quite frustrated as she expected a stent to be placed as she says this has been, they guess most of the time she has been seen in the hospital.  Her white count has improved from a high of 15,000 on January 15th to 10.9, which is better, but still a little high.  The plan is to give her at least 48 hours off gentamicin and ceftriaxone. If she continues to have fever, would revisit need for stent placement before discharging home.  Otherwise, the patient also complains of cough today.  PHYSICAL EXAMINATION: GENERAL:  The patient is not in acute distress.  She is coughing some, VITAL SIGNS:  Blood pressure 172/81, T max is 101.3, heart rate is 94, respirations 16, oxygen saturation is 96% on room air.  HEAD, EARS, NOSE, AND THROAT:  No jugular venous distention.  No carotid bruits. RESPIRATORY:  Bilateral air entry with basilar coarse rhonchi.  No wheezing. CARDIOVASCULAR:  S1, S2 heard.  No murmurs.  Pulse regular. ABDOMEN:  Soft, nontender.  No palpable organomegaly.  Bowel sounds are normal. CNS: The patient seems kind of slow, but does not have any localizing signs. EXTREMITIES:  No pedal edema.  Peripheral pulses equal.  LABORATORY DATA: Labs reviewed.  Significant for WBC 10.9, hemoglobin 8.6, hematocrit 26.8, platelet count 317.  Sodium 137, potassium 4.7, BUN is 1.48, gentamicin 7.2 mg.  MEDICATIONS: Reviewed.  ASSESSMENT AND PLAN: Left-sided hydronephrosis, pyelonephritis.  Urology input appreciated. Continue ceftriaxone and gentamicin and for now.  Place PICC line for possible discharge on ceftriaxone to complete 2 weeks of antibiotics. However, if the patient continues to have fever, I think she needs some kind of urological intervention. Postobstructive uropathy this has improved.  However, given  use of possible nephrotoxic drugs including gentamicin needs close monitoring. Hypertension.  Blood pressure not optimally controlled with increased Coreg to 6.25 mg b.i.d. Cough and bibasilar rhonchi.  Concern for pneumonia.  We will obtain chest x-ray to stop fluids. Diabetes mellitus type 2, seems generally controlled.  Continue Lantus in aspart insulin. Deep venous thrombosis prophylaxis, heparin subcutaneous.  DISPOSITION: Anticipate discharge home with home health services in the next few days.     Conley Canal, MD     SR/MEDQ  D:  03/02/2010  T:  03/02/2010  Job:  [id number]  Electronically Signed by Conley Canal  on 03/19/2010 09:30:29 PM

## 2010-05-05 ENCOUNTER — Other Ambulatory Visit (HOSPITAL_COMMUNITY): Payer: Self-pay | Admitting: Family Medicine

## 2010-05-05 DIAGNOSIS — Z1231 Encounter for screening mammogram for malignant neoplasm of breast: Secondary | ICD-10-CM

## 2010-05-12 ENCOUNTER — Ambulatory Visit (HOSPITAL_COMMUNITY): Payer: Medicare Other

## 2010-06-30 NOTE — H&P (Signed)
NAME:  Donna Arnold, POTEET NO.:  [id number]   MEDICAL RECORD NO.:  [id number]          PATIENT TYPE:  EMS   LOCATION:  MAJO                         FACILITY:  [facility]   PHYSICIAN:  Thora Lance, M.D.  DATE OF BIRTH:  Jul 31, 1950   DATE OF ADMISSION:  02/11/2006  DATE OF DISCHARGE:                              HISTORY & PHYSICAL   CHIEF COMPLAINT:  Chest pressure/pain.   HISTORY OF PRESENT ILLNESS:  A 60 year old black female with a history  of hypertension, diabetes, who was sent to the ER today by Dr. Arvilla Market  today after she presented to his office complaining of 4 days of  intermittent pressure in her central chest.  No shortness of breath.  She had been feeling generally weak.  She had gone to the walk-in clinic  on Saturday for some edema that she had delivered on her last admission  to the hospital in December.  She was given Lasix, which she took, 4  tablets on Saturday, 1 on Sunday and 1 today.  She had been admitted to  the hospital December 14 through December 21 with UTI/urosepsis, and was  found to have an obstructive left ureter.  A left double-J stent was  placed by Dr. Earlene Plater on December 24.  Her UTI was caused by resistant E.  coli.   PAST MEDICAL HISTORY:  1. Diabetes mellitus.  2. Hypertension.  3. UTI/urosepsis.  4. Chronic renal insufficiency.  5. Asthma.  6. ALLERGY TO SULFA.   CURRENT MEDICATIONS:  Glipizide 5 mg b.i.d., metformin 500 mg b.i.d.,  Catapres-TTS-2 patch q.week.   OBJECTIVE:  VITAL SIGNS:  Temperature 99.2, blood pressure 183/91, heart  rate 108, respirations 20, oxygen saturation 96% on room air.  HEENT:  Pupils equal, responding to light.  Extraocular movements  intact.  Conjunctiva clear.  NECK:  Supple, no lymphadenopathy.  LUNGS:  Clear.  HEART:  Regular rate and rhythm, no murmurs, gallops or rubs.  ABDOMEN:  Soft, nontender, normal bowel sounds, no mass or  hepatosplenomegaly.  EXTREMITIES:  Show 1+ bilateral  pretibial edema.   LABORATORIES:  Chemistry:  Sodium 137, potassium 3.3, chloride 100,  bicarbonate 35, BUN 4, creatinine 0.1, 0.2, glucose 186, MB less than  1.0, troponin less than 0.05, BNP 39, ABG on room air pH 7.55, pCO2 41,  pO2 60, oxygen saturation 94% on room air.   Chest x-ray shows cardiomegaly and no acute disease.  EKG:  Sinus  tachycardia, otherwise normal.  A V/Q scan on December 15 was normal.  CBC:  WBC 4.5, hemoglobin 9.7, which is rising compared to her last  admission, platelets 317, albumin 2.8.   ASSESSMENT:  1. Chest pressure, rule out unstable angina or myocardial infarction.      Her first set of cardiac enzymes and EKG are unremarkable.  2. Metabolic alkalosis/hypokalemia, likely secondary to recent doses      of Lasix.  3. Recent urinary tract infections/urosepsis/left hydronephrosis,      status post double-J stent.  4. Sinus tachycardia.  5. Hypertension.  6. Diabetes.  7. Peripheral edema.   PLAN:  Recommend that  the patient be admitted to telemetry and rule out  MI.  Recommend she should have repeat cardiac enzymes and an EKG in the  morning and clinical monitoring for several hours.  At that point, if  clinically stable, she could conceivably be discharged to home and set  up for an outpatient ischemic workup.  I recommended that the patient  pursue this course, but she declined that.  She opted to go home against  medical advice.  I did explain to her that if she has, in fact, had an  MI, she could be at risk for serious cardiac complications.  The patient  declined admission or observation status and was discharged against  medical advise.           ______________________________  Thora Lance, M.D.     JJG/MEDQ  D:  02/12/2006  T:  02/12/2006  Job:  [id number]   cc:   Donia Guiles, M.D.

## 2010-06-30 NOTE — Op Note (Signed)
NAME:  Donna Arnold, Donna Arnold             ACCOUNT NO.:  [id number]   MEDICAL RECORD NO.:  [id number]          PATIENT TYPE:  AMB   LOCATION:  DAY                          FACILITY:  [facility]   PHYSICIAN:  Ronald L. Earlene Plater, M.D.  DATE OF BIRTH:  [birth year]   DATE OF PROCEDURE:  02/04/2006  DATE OF DISCHARGE:                               OPERATIVE REPORT   PREOPERATIVE DIAGNOSIS:  Left hydronephrosis, urinary tract infection.   POSTOPERATIVE DIAGNOSIS:  Left pyonephrosis.   OPERATIVE PROCEDURE:  Cystourethroscopy, left retrograde pyelogram,  culture and sensitivity of left renal pelvis and placement of left  double-J stent.   SURGEON:  Gaynelle Arabian, M.D.   ANESTHESIA:  General endotracheal.   BLOOD LOSS:  Negligible.   TUBES:  24 cm 8-French contoured double pigtail stent.   COMPLICATIONS:  None.   INDICATIONS FOR PROCEDURE:  Donna Arnold is a 60 year old black female  who presented to the hospital service that had [facility] with what  appeared to be lethargy, urosepsis and progressive failure to thrive.  She was subsequently found on workup to have a left hydronephrosis which  appeared to be a ureteropelvic junction obstruction and recommendations  for placement of a double-J stent were made at that time.  She initially  did not want to have this done, but finally agreed to proceed with it.  She was discharged from hospital with home care, IV antibiotics and  through some confusion was not readmitted, but now presents this morning  for procedure.  I discussed the situation was her and her husband in  great detail and after understanding risks, benefits, and alternatives,  they elected to proceed with cysto and placement of left double-J stent.  She will be admitted to the hospital of by [facility] hospitalist following  this for definitive care.  She realizes the stent is temporary, that it  could potentially cause stone and that she will need something  definitively done on the urinary  tract at a later date and understanding  this, they wished to proceed accordingly.   PROCEDURE IN DETAIL:  The patient was placed in supine position.  After  proper general endotracheal anesthesia, was placed in the dorsal  lithotomy position, prepped and draped with Betadine in a sterile  fashion.  Cystourethroscopy was performed with a 22.5 [place] Olympus  panendoscope.  The bladder was carefully inspected noted to be without  lesion.  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 to discern  this.  The left ureteral orifice was identified.  A 0.038 French sensor  wire was placed into the left renal pelvis.  A 6-French open-ended  catheter was placed over it and dye was injected.  She was noted to have  marked left hydronephrosis and also had kinking of the left  ureteropelvic junction in a C type fashion.  The remainder the ureter  appeared to be normal.  The fluid was extracted from the renal pelvis  approximately 50 mL of purulent material which was submitted for culture  and sensitivity and again dye  was injected under very low pressure.  The  lower ureter was somewhat tight.  Therefore, it was dilated with the  ureteral access dilating sheath and under fluoroscopic guidance a 24-cm  8-French contoured double pigtail stent was placed  and noted to be in good position within the left renal pelvis within the  bladder.  The bladder was drained.  The panendoscope was removed.  The  plan is to have her be admitted to the [facility] hospitalist service and we  will make definitive recommendations when the situation is stabilized.      Ronald L. Earlene Plater, M.D.  Electronically Signed     RLD/MEDQ  D:  02/04/2006  T:  02/04/2006  Job:  [id number]   cc:   Dennie Maizes, M.D.  Fax: [phone]   Georgiann Cocker, RN

## 2010-06-30 NOTE — Discharge Summary (Signed)
NAME:  Donna Arnold, Donna Arnold             ACCOUNT NO.:  [id number]   MEDICAL RECORD NO.:  [id number]          PATIENT TYPE:  INP   LOCATION:  4735                         FACILITY:  [facility]   PHYSICIAN:  Hollice Espy, M.D.DATE OF BIRTH:  07-31-1950   DATE OF ADMISSION:  01/25/2006  DATE OF DISCHARGE:  02/01/2006                               DISCHARGE SUMMARY   ATTENDING PHYSICIAN:  Hollice Espy, M.D.   PRIMARY CARE PHYSICIAN:  Donia Guiles, M.D.   CONSULTANTS:  Lucrezia Starch. Earlene Plater, M.D., urology.   DISCHARGE DIAGNOSES:  1. Pyelonephritis with Escherichia coli resistant urine cultures.  2. Left uretal obstruction causing moderate hydronephrosis, cause      unclear.  3. Diabetes mellitus, uncontrolled hyperglycemia.  4. Medical noncompliance.  5. Hypertension.  6. Acute on chronic renal failure, back to baseline.  7. Hypoxia from mild asthma exacerbation, now resolved.  8. 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   DISCHARGE MEDICATIONS:  The patient will be discharged on:  1. IV Rocephin 1 gram daily to be given on February 02, 2006 -      Saturday, and February 03, 2006 - Sunday.  2. She will also be discharged on her previous medications of:      a.     Clonidine patch TTS 0.2 mg topically every week.      b.     Metformin 500 p.o. b.i.d.      c.     Glipizide 5 p.o. b.i.d.      d.     Lasix 40 p.o. daily.   HOSPITAL COURSE:  The patient is a 60 year old African American female  with a past medical history of diabetes mellitus and hypertension under  poor control, who was being treated for a UTI as an outpatient by Dr.  Roselie Skinner office on Cipro.  Her cultures returned back resistant to  Cipro and she was changed over to Ceftin.  She continued to feel worse  and came into the emergency room.  There she was noted to be  tachycardic, febrile, complaining of abdominal and back pain with an  elevated white count of 14.  The patient was started on IV Rocephin  and  given her signs of questionable early sepsis, she was placed in the step  down unit.  Over the next several days, the patient continued to have  febrile spikes and slow improvement.  She was also noted to be somewhat  hypoxic and there was a concern of whether or not she had a pulmonary  embolus, although a CT scan ruled out any kind of blood clot as well.  She gives an old history of asthma, and I suspect that she may have a  mild asthma exacerbation.  She was put on oxygen, requiring more and  more up to BiPAP, which she tolerated well and her breathing  significantly improved.  An ultrasound of her kidneys was done to rule  out abscess.  While no abscess was seen, she was noted to have moderate  hydronephrosis on the left kidney.  Urology was consulted.  Once the  patient was aggressively hydrated,  her renal function improved to the  point where she was able to get a CT of the abdomen and pelvis.  This  showed an obstruction at the left UPJ consistent with a possible stone  or other cause for obstruction.  A cystoscopy was recommended and  planned by Dr. Earlene Plater on February 01, 2006.  However, on January 31, 2006, the patient declined to have this done.  Initially, the patient  had also previously  declined a CT that Dr. Earlene Plater had recommended and  ordered.  With these continued declamation of treatment, Dr. Earlene Plater had  signed off on the patient.  I had a long extensive talk with the patient  and her husband and due to questionable issues, the patient and her  husband seemed to be frustrated and had problems processing the  patient's acute illness.  I explained to the patient that the  antibiotics are not completely treating her infection and that all the  patient wanted to do was go home.  I explained to the patient that  without proper treatment, she runs the risk of possibly loosing her  kidney or even further infection leading to sepsis and death.  I  explained this to the  patient as well as separately to her husband on  the phone.  With this explanation, the patient then stated that she  would like to have the procedure done.  I spoke with Dr. Earlene Plater, who had,  however, rearranged his surgery dates for Friday, February 01, 2006, but  was kind enough to set up an extra OR day on the 24th to have her  procedure done.  I explained this to the patient.  She stated very much  that she wanted to go home.  When I told her that I was concerned  because despite now over a week of antibiotics, her white count is still  not normalized and __________  .  She stated that she was going to go  home one way or the other.  The patient seems to be quite focused on  these issues and despite multiple simplified explanations by myself as  well as Dr. Earlene Plater, she and her husband at times remained adamant and  while she and her husband showed signs of complete mental comprehension,  I feel, however, that their priorities may be misplaced and was  concerned about they may have perhaps an underlying personality  disorder.  I had asked psychiatry to consult given the patient's  flattened affect, and at times questionable logic, however, the patient  is again wanting to leave despite the fact I told her she was leaving  against medical advice.  We were able to at least compromise on the  aspect of that she is willing to go home with an IV and receive 2 days  of IV Rocephin and return back on Sunday evening.  The patient does this  with the understanding again that this is against my medical advice  which is to stay in the hospital, given the fact that her white count is  still not normalized.  She understands this and still remains adamant  about going home.  She assures me that she will at least return back on  Sunday evening to have her cystoscopy done on Monday morning, which I  certainly hope will be the case.   In regards to her other medications, due to already compliance  issues, I am not changing her diabetic or hypertensive medications, although I  controlled her hyperglycemia while here in the hospital with Lantus.   The patient's overall disposition from initial presentation is slightly  improved, however, again I am recommending as per my medical opinion  which has been shared with the patient that she stay in the hospital  which she has declined.   Her discharge diet will be a carb-modified, heart healthy diet.   She is being discharged to home, her activity is recommended to be bed  rest.   Home health will be set up to follow up with the patient for IV  antibiotics x2 days.   PLAN:  For the patient to return to either [facility] or [facility] for a followup cystoscopy at [facility] on  Monday morning by Dr. Earlene Plater.      Hollice Espy, M.D.  Electronically Signed    SKK/MEDQ  D:  02/01/2006  T:  02/01/2006  Job:  [id number]   cc:   Donia Guiles, M.D.  Ronald L. Earlene Plater, M.D.

## 2010-06-30 NOTE — H&P (Signed)
NAME:  Donna Arnold, Donna Arnold NO.:  [id number]   MEDICAL RECORD NO.:  [id number]          PATIENT TYPE:  INP   LOCATION:  3301                         FACILITY:  [facility]   PHYSICIAN:  Hollice Espy, M.D.DATE OF BIRTH:  1950/11/11   DATE OF ADMISSION:  01/25/2006  DATE OF DISCHARGE:                              HISTORY & PHYSICAL   PRIMARY CARE PHYSICIAN:  Dr. Donia Guiles.   CHIEF COMPLAINT:  Fevers and frequent urination.   HISTORY OF PRESENT ILLNESS:  The patient is a 60 year old African-  American female with a past medical history of obesity, diabetes, and  recurrent UTI.  She was followed by Dr. Arvilla Market and was recently  treated with Cipro for an E. Coli UTI.  She has continued to feel worse  complaining of weakness, fevers, chills, and back pain to the point  where she could not take anymore and came in to the emergency room.  In  the ER, contacted the office who was able to send over an updated  culture report that was not available when the patient was started on  Cipro showing an E. Coli UTI  however resistant to Cipro but sensitive  to ceftriaxone.  The patient had been recently contacted one day prior  to coming into the hospital and been told to change her Cipro over to  Ceftin.  The patient was found to have a large UTI in the emergency room  with a white count of 15.1.  Other labs of note of concern were a BUN of  17, a creatinine of 2.4, and a sugar of 539.  The patient was started on  insulin Glucommander, given IV fluids and a dose of Rocephin.   Currently, she states she is feeling better but very weak and tired.  She denies any headaches, vision changes, dysphagia, chest pain,  palpitations, shortness of breath, wheezing, coughing, abdominal pain,  hematuria.  She does complain of dysuria, near constipation or diarrhea.  No focal history of numbness, weakness, or pain, and review of systems  is otherwise negative.   The patient's past  medical history includes:  1. Obesity.  2. Diabetes mellitus.  3. Recurrent UTIs.  4. Hypertension.   MEDICATIONS:  She is on:  Clonidine, glipizide, Lasix, and metformin.   SHE HAS ALLERGIES TO SULFA AND QUESTIONABLE TO NARCOTIC MEDICATION.   SOCIAL HISTORY:  She denies any tobacco, alcohol, or drug use.   FAMILY HISTORY:  Noncontributory.   PHYSICAL EXAMINATION:  The patient's vitals on admission, temperature  102, heart 135 none under 109, blood pressure 121/71, respirations 24,  O2 saturation 99% on room air.  GENERAL:  The patient is alert and oriented x3.  No apparent distress.  HEENT:  Normocephalic, atraumatic.  Her mucous membranes are dry.  She has no carotid bruits.  HEART:  Regular rate and rhythm, S1, S2.  LUNGS:  Clear to auscultation bilaterally.  ABDOMEN:  Soft, nontender, nondistended, positive bowel sounds.  EXTREMITIES:  No clubbing, cyanosis, trace pitting edema.   LABORATORY WORK:  White count 15.1, hemoglobin and hematocrit 9.8 and  29, MCV is 84,  platelet count 333, 96% shift.  UA shows a moderate  amount of hemoglobin, greater than 1000 glucose, large leukocyte  esterase, and 15 of ketones with too numerous to count white cells, and  many bacteria.  Sodium is 128, potassium 4.4, chloride 100, bicarbonate  14, BUN 17, creatinine 2.4, glucose 539.  Her anion gap is 14.  Blood  cultures are drawn and pending.   ASSESSMENT ON LIMB MOVEMENT:  1. Pyelonephritis with suspected sepsis.  Will put the patient in the      stepdown unit, start her on IV f fluids and IV Rocephin and follow      accordingly.  2. Uncontrolled hypoglycemia type 2.  She may have some minimal      acidosis.  I do not think that this is DKA, more likely may be from      some early signs of sepsis.  Will continue insulin Glucommander      until her sugars are below 120 and then can change her over to      insulin sliding scale and oral agents.  3. Obesity.  4. Hypertension.    Currently, she is normotensive and will monitor for hypotension.  Hold  her oral medications.      Hollice Espy, M.D.  Electronically Signed     SKK/MEDQ  D:  01/25/2006  T:  01/26/2006  Job:  [id number]   cc:   Donia Guiles, M.D.

## 2010-06-30 NOTE — Discharge Summary (Signed)
NAME:  [name], Donna Arnold             ACCOUNT NO.:  [id number]   MEDICAL RECORD NO.:  [id number]          PATIENT TYPE:  OIB   LOCATION:  1408                         FACILITY:  [facility]   PHYSICIAN:  Theone Stanley, MD   DATE OF BIRTH:  Oct 29, 1950   DATE OF ADMISSION:  02/04/2006  DATE OF DISCHARGE:                               DISCHARGE SUMMARY   ADMITTING DIAGNOSES:  1. Pyelonephritis.  2. Moderate hydronephrosis of the left ureter.  3. Diabetes.  4. Hypertension.  5. Asthma.  6. 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  7. Recurrent urinary tract infection.  8. Anemia, iron deficient.   DISCHARGE DIAGNOSES:  1. Pyelonephritis with resistant Escherichia coli to quinolones.  2. Hydronephrosis of the left ureter status post stent placement.  3. Acute and chronic insufficiency.  4. Hypertension.  5. Asthma.  6. 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  7. Medical noncompliance.  8. Anemia, iron deficient.   CONSULTATIONS:  Dr. Earlene Plater.   PROCEDURES AND DIAGNOSTIC TESTS:  The patient had a left JJ stent placed  in the left ureter by Dr. Earlene Plater.   HOSPITAL COURSE:  1. [name] is a very pleasant, 60 year old female who presented      to the hospital earlier in December with what appeared to be      pyelonephritis.  After urinary cultures were obtained, it was noted      that she had a quinolone resistant E. coli.  The patient insisted      on leaving.  IV antibiotics were set up for the patient for 2 days.      She returned to the hospital for a double JJ stent placed.  This      was done on the 24th without any complications.  The patient had no      problems postoperatively.  She was kept in the hospital for      observation. After much discussion with the pharmacy, it was felt      the best option for this patient would be Ceftin 500 b.i.d.  better      for compliance.  The patient is anemic.  She is aware of this,      refuses transfusion secondary to her being a [employer].  We    will give her iron supplementation with the hopes of this coming up      on her own.  2. History of diabetes.  Initially, her metformin was stopped because      of a renal dysfunction.  Because her renal function normalized, we      will resume her home medications which include glipizide and      metformin.  3. History of hypertension.  She will continue on her clonidine.   DISCHARGE MEDICATIONS:  1. Iron sulfate 325 mg 1 p.o. b.i.d.  2. Ceftin 500 mg 1 p.o. b.i.d. for a total of 10 days.  3. Glipizide 5 mg b.i.d.  4. Metformin 500 mg b.i.d.  5. Catapres patch 0.2 mg q. weekly.   The patient is to follow up with Dr. Arvilla Market in 5-7 days and Dr. Earlene Plater  in 2-3  weeks.  The patient is instructed to return to the hospital if  she she has fevers or chills or any evidence of recurrent infection.  The patient left in a stable condition to home.      Theone Stanley, MD  Electronically Signed     AEJ/MEDQ  D:  02/05/2006  T:  02/05/2006  Job:  [id number]

## 2010-06-30 NOTE — H&P (Signed)
NAME:  Donna Arnold, REDDIX NO.:  [id number]   MEDICAL RECORD NO.:  [id number]          PATIENT TYPE:  OIB   LOCATION:  1408                         FACILITY:  [facility]   PHYSICIAN:  Theone Stanley, MD   DATE OF BIRTH:  04/12/1950   DATE OF ADMISSION:  02/04/2006  DATE OF DISCHARGE:                              HISTORY & PHYSICAL   HISTORY OF PRESENT ILLNESS:  Mrs. Lees is a 60 year old African-  American female who was recently in the hospital on December 14 and was  discharged on December 21 on the insistence of her and her husband  despite warnings about being discharged.  It was set up that she would  return today for a urethral stent by Dr. Earlene Plater.  This was performed  without any complications and the patient is being admitted for  observation.  In conclusion, on December 14 the patient was admitted for  fevers and frequent urination.  She was seen by Dr. Arvilla Market and was  treated with Cipro for an E. coli UTI.  She has recurrent UTIs, and  because of worsening symptoms, back pain, she came to the emergency  room.  Upon evaluation it was noted that her E. coli was resistant to  Cipro, was sensitive to ceftriaxone and nitrofurantoin, sulfa, and  cefazolin.   PAST MEDICAL HISTORY:  1. Recurrent urinary tract infections.  2. Diabetes.  3. Medical noncompliance.  4. Hypertension.  5. Acute-on-chronic insufficiency.  6. Asthma.  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  8. Pyelonephritis.   MEDICATIONS:  1. Clonidine patch 0.2 mg weekly.  2. Metformin 500 b.i.d.  3. Glipizide 5 b.i.d.  4. Lasix 40 daily.   ALLERGIES:  SULFA and questionable to NARCOTIC MEDICATION.   SURGERIES:  The patient recently had urethral stents placed.   SOCIAL HISTORY:  The patient lives here in [place], is married.  No  tobacco, alcohol or illicit drug use.  She is a TEFL teacher Witness and  does not agree with blood transfusions.   FAMILY HISTORY:  Significant for diabetes.   REVIEW OF SYSTEMS:  Please see HPI.  Otherwise, all systems were  negative.   PHYSICAL EXAMINATION:  GENERAL:  Mrs. Slaugh is a pleasant 60 year old  female in no acute distress, sitting in the bed.  VITAL SIGNS:  Temperature of 98.3, pulse of 102, respiratory rate of 20,  blood pressure 140/92, blood sugar 144, saturating 94% on room air.  HEENT:  Head atraumatic, normocephalic.  Mucosa was dry.  Extraocular  movements intact.  Ears and nose no discharge, throat clear, no  erythema.  NECK:  Supple, no lymphadenopathy.  HEART:  Regular rate and rhythm, no murmurs, rubs or gallops  appreciated.  LUNGS:  Clear to auscultation bilaterally.  ABDOMEN:  Soft, nontender, nondistended.  EXTREMITIES:  The patient had 2+ pitting edema, no cyanosis or clubbing.  PSYCHIATRIC:  The patient had flat affect but otherwise able to answer  questions appropriately.  NEUROLOGIC:  Was nonfocal.  SKIN:  No rashes or jaundice appreciated.  GENITOURINARY:  Deferred.   LABORATORY WORK:  CBC:  White count of 8, hemoglobin of 8.2,  hematocrit  of 24, platelets of 474.  BNP showed a sodium 139, potassium of 4.1,  chloride at 108, CO2 of 22, glucose at 185, BUN at 6, creatinine at 1.3,  calcium at 8.4.  Gram stain showed abundant wbc's, much organisms seen.   ASSESSMENT AND PLAN:  1. 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  The patient is doing well postoperatively and this      management will be up to Dr. Earlene Plater.  2. Pyelonephritis.  The patient has E. coli resistant to      ciprofloxacin.  She received 2 days of Rocephin.  Will continue      this and then probably switch her to a second or third generation      oral cephalosporin.  She will need to be reassessed as an      outpatient to make sure that this is treating her infection      appropriately.  3. Diabetes with poor control.  There is an issue of noncompliance.      At this point in time I have stopped  her metformin because of her      renal dysfunction concerning with metabolic acidosis.  Will      increase her glipizide to 10 b.i.d.  I explained to the patient      that she at some point in time will most likely need insulin;      however, she does not want to leave the hospital with insulin.  4. Obesity.  5. Hypertension.  Will continue on her clonidine patch.      Theone Stanley, MD  Electronically Signed     AEJ/MEDQ  D:  02/04/2006  T:  02/04/2006  Job:  [id number]   cc:   Donia Guiles, M.D.  Fax: [phone]   Lucrezia Starch. Earlene Plater, M.D.  Fax: [phone]

## 2010-06-30 NOTE — Consult Note (Signed)
NAME:  Donna Arnold, Donna Arnold             ACCOUNT NO.:  [id number]   MEDICAL RECORD NO.:  [id number]           PATIENT TYPE:   LOCATION:                                 FACILITY:   PHYSICIAN:  Ronald L. Earlene Plater, M.D.       DATE OF BIRTH:   DATE OF CONSULTATION:  01/29/2006  DATE OF DISCHARGE:                                 CONSULTATION   UROLOGIC CONSULTATION:   CHIEF COMPLAINT:  I feel terrible.   HISTORY OF PRESENT ILLNESS:  Donna Arnold is a very nice 60 year old,  obese, diabetic, black female who presented with fever, chills and  lethargy.  She notes over the last month or so she has not felt well.  She has been on p.o. Cipro for a urinary tract infection.  She had  progressive problems and subsequently was admitted with fevers and  frequent urination.  She was felt to have pyelonephritis and  uncontrolled type 2 diabetes, obesity and hypertension.  She was begun  on IV Rocephin, and appropriate cultures were obtained.  She was  subsequently found to have an E. coli, which was quinolone resistant,  and is now being appropriately treated.  An ultrasound of the kidneys  revealed a moderate left hydronephrosis, but the ureter could not be  well visualized due to bowel gas and other issues; therefore, the source  of the hydronephrosis could not be determined.  She now presents for  evaluation and definitive management of the hydronephrosis and the  urinary tract infection.   PAST MEDICAL HISTORY:  Pertinent.   She has allergies to SULFA and QUESTIONABLE NARCOTIC MEDICATION.  There  is also a questionable allergy to MACROBID.   MEDICATION:  She was on clonidine, glipizide, Lasix, metformin.  She had  taken Cipro previously and is now currently on IV Zithromax and having  her diabetes managed with insulin.   She has had a C-section and a hysterectomy previously.   SOCIAL HISTORY:  She is a negative smoker, negative drinker.   FAMILY HISTORY:  Nonsignificant.   REVIEW OF SYSTEMS:   She has been weak and tired.  She had some fever and  chills.  Some urgency and frequency, but no blood in her urine.  She  denies headaches, visual changes, dysphagia, chest pain, palpitations,  shortness of breath, wheezing, cough, abdominal pain or hematuria.  She  has no focal numbness or weakness, and all other review of systems are  negative.   PHYSICAL EXAMINATION:  VITAL SIGNS:  Temperature is currently 98 degrees  Fahrenheit.  Blood pressure 160/99.  Pulse 80.  GENERAL:  She is well nourished, well developed, obese, in no acute  distress, oriented x3.  HEAD, EARS, EYES, NOSE, THROAT:  Normal.  NECK:  Without masses or thyromegaly.  CHEST:  Normal diaphragmatic motion.  ABDOMEN:  Soft, nontender without masses, organomegaly or hernias.  EXTREMITY:  Normal.  NEURO:  Appears to be grossly intact.  SKIN:  Normal.  HEART:  Normal sinus rhythm without murmurs or gallops.  BACK:  There is mild left CVA tenderness due to most likely the  pyelonephritis.  PERTINENT LABORATORY EVALUATION:  BUN/creatinine is 28/2.0.  White blood  cell count is 13.5, and hemoglobin and hematocrit are 9.2/26.8.  Urine  culture and sensitivity had a quinolone-resistant E. coli.   IMPRESSION:  1. Urinary tract infection with probable left pyelonephritis.  2. Diabetes mellitus.  3. Left hydronephrosis.   PLAN:  A CT abdomen and pelvis without contrast to determine the source  of the hydronephrosis.  Larina Bras is likely.  Once we determine the source,  draining the kidney with a stent is most appropriate and will certainly  be indicated.  I will follow tomorrow after CT scan and make further  recommendations.      Ronald L. Earlene Plater, M.D.  Electronically Signed     RLD/MEDQ  D:  01/29/2006  T:  01/30/2006  Job:  [id number]   cc:   Deirdre Peer. Polite, M.D.

## 2010-11-24 LAB — URINALYSIS, ROUTINE W REFLEX MICROSCOPIC
Bilirubin Urine: NEGATIVE
Nitrite: NEGATIVE
Protein, ur: NEGATIVE
Urobilinogen, UA: 0.2

## 2010-11-24 LAB — BASIC METABOLIC PANEL
BUN: 9
CO2: 22
Calcium: 9.9
Chloride: 105
Creatinine, Ser: 0.87
GFR calc Af Amer: >60
Glucose, Bld: 178 — ABNORMAL HIGH

## 2010-11-24 LAB — URINE MICROSCOPIC-ADD ON

## 2010-11-24 LAB — HEMOGLOBIN AND HEMATOCRIT, BLOOD: HCT: 37.9

## 2012-09-25 ENCOUNTER — Other Ambulatory Visit (HOSPITAL_COMMUNITY): Payer: Self-pay | Admitting: Podiatry

## 2012-09-25 ENCOUNTER — Ambulatory Visit (HOSPITAL_COMMUNITY)
Admission: RE | Admit: 2012-09-25 | Discharge: 2012-09-25 | Disposition: A | Discharge status: Home / Self Care | Payer: Medicare Other | Source: Ambulatory Visit | Attending: Cardiovascular Disease | Admitting: Cardiovascular Disease

## 2012-09-25 DIAGNOSIS — M79609 Pain in unspecified limb: Secondary | ICD-10-CM

## 2012-09-25 NOTE — Progress Notes (Signed)
Right Lower Extremity Venous Duplex Completed. Negative. Donna Arnold

## 2012-11-07 ENCOUNTER — Ambulatory Visit: Payer: Medicare Other | Admitting: Cardiovascular Disease

## 2013-04-20 DIAGNOSIS — I872 Venous insufficiency (chronic) (peripheral): Secondary | ICD-10-CM | POA: Insufficient documentation

## 2013-05-13 DIAGNOSIS — J302 Other seasonal allergic rhinitis: Secondary | ICD-10-CM | POA: Insufficient documentation

## 2013-06-30 DIAGNOSIS — E785 Hyperlipidemia, unspecified: Secondary | ICD-10-CM | POA: Insufficient documentation

## 2014-03-18 DIAGNOSIS — N820 Vesicovaginal fistula: Secondary | ICD-10-CM | POA: Insufficient documentation

## 2014-12-06 DIAGNOSIS — R5382 Chronic fatigue, unspecified: Secondary | ICD-10-CM | POA: Insufficient documentation

## 2014-12-06 DIAGNOSIS — D8989 Other specified disorders involving the immune mechanism, not elsewhere classified: Secondary | ICD-10-CM

## 2014-12-06 DIAGNOSIS — G9332 Myalgic encephalomyelitis/chronic fatigue syndrome: Secondary | ICD-10-CM | POA: Insufficient documentation

## 2014-12-06 DIAGNOSIS — E1165 Type 2 diabetes mellitus with hyperglycemia: Secondary | ICD-10-CM | POA: Insufficient documentation

## 2015-06-01 ENCOUNTER — Ambulatory Visit (INDEPENDENT_AMBULATORY_CARE_PROVIDER_SITE_OTHER): Payer: Medicare Other

## 2015-06-01 ENCOUNTER — Ambulatory Visit (INDEPENDENT_AMBULATORY_CARE_PROVIDER_SITE_OTHER): Payer: Medicare Other | Admitting: Family Medicine

## 2015-06-01 VITALS — BP 144/94 | HR 91 | Temp 98.8°F | Resp 17 | Ht 64.5 in | Wt 270.0 lb

## 2015-06-01 DIAGNOSIS — R05 Cough: Secondary | ICD-10-CM

## 2015-06-01 DIAGNOSIS — R059 Cough, unspecified: Secondary | ICD-10-CM

## 2015-06-01 DIAGNOSIS — I10 Essential (primary) hypertension: Secondary | ICD-10-CM | POA: Insufficient documentation

## 2015-06-01 DIAGNOSIS — N183 Chronic kidney disease, stage 3 unspecified: Secondary | ICD-10-CM | POA: Insufficient documentation

## 2015-06-01 NOTE — Progress Notes (Signed)
   HPI  Patient presents today with cough  Pt explains she has had a arecnet Hx of cough and nasal congestion treated by her PCP with amox X 2  Currently she has persistent cough which is slightly improved over a few weeks ago. She has chest tightness and mild dyspnea.   She is not a smoker  She also has some increased leg swelling over the last 3 weeks, it has improved some, her PCP ruled out DVT with vrenous duplex approx 2 weeks ago  Her first course of amox was approx 4 weeks ago for sinus infection, her second was anb=bout 2-3 weeks ago for cough after eval for DVT.   She denies fever, chills, and decreased appetite.  She attributes many symptoms to NPH and diltiazem dosage changes.   PMH: Smoking status noted Her past medical, surgical, social, and family Hx reviewed and updated in EMR ROS: Per HPI  Objective: BP 144/94 mmHg  Pulse 91  Temp(Src) 98.8 F (37.1 C) (Oral)  Resp 17  Ht 5' 4.5" (1.638 m)  Wt 270 lb (122.471 kg)  BMI 45.65 kg/m2  SpO2 95% Gen: NAD, alert, cooperative with exam HEENT: NCAT CV: RRR, good S1/S2, no murmur Resp: CTABL, no wheezes, non-labored Ext: No edema, warm Neuro: Alert and oriented, No gross deficits  DG chest Cardiomegaly, stable from 2012, No infiltrates  Assessment and plan:  # Cough S/p Tx with amox X 2 CXR clear with stable cardiomegaly Likely lingering postinfectious cough Reassurance provided Recommend close follow up with her PCP     Orders Placed This Encounter  Procedures  . DG Chest 2 View    Standing Status: Future     Number of Occurrences:      Standing Expiration Date: 07/31/2016    Order Specific Question:  Reason for Exam (SYMPTOM  OR DIAGNOSIS REQUIRED)    Answer:  cough, ecval for CAP    Order Specific Question:  Preferred imaging location?    Answer:  External     Kevin FentonSamuel Okema Rollinson, MD 12:17 PM

## 2015-06-01 NOTE — Patient Instructions (Signed)
Great to meet you!  It sounds like you have a persistent postinfectious cough.   Be sure to keep following closely with your doctor for your other concerns.

## 2015-06-09 ENCOUNTER — Telehealth: Payer: Self-pay

## 2015-06-09 NOTE — Telephone Encounter (Signed)
Addendum to previous note--Chest x-ray RESULTS mailed.

## 2015-06-09 NOTE — Telephone Encounter (Signed)
The patient would like a copy of her chest x-rays mailed her her address.  [address] [place] [id number]

## 2015-06-10 NOTE — Telephone Encounter (Signed)
Report printed to be mailed out.
